# Patient Record
Sex: Female | Born: 1970 | Race: Asian | Hispanic: No | Marital: Married | Smoking: Never smoker
Health system: Southern US, Community
[De-identification: ages and names within clinical notes are randomized; demographics above are authoritative.]

## PROBLEM LIST (undated history)

## (undated) DIAGNOSIS — D649 Anemia, unspecified: Secondary | ICD-10-CM

## (undated) DIAGNOSIS — I10 Essential (primary) hypertension: Secondary | ICD-10-CM

---

## 1999-06-30 ENCOUNTER — Other Ambulatory Visit: Admission: RE | Admit: 1999-06-30 | Discharge: 1999-06-30 | Payer: Self-pay | Admitting: *Deleted

## 1999-08-10 ENCOUNTER — Inpatient Hospital Stay (HOSPITAL_COMMUNITY): Admission: AD | Admit: 1999-08-10 | Discharge: 1999-08-10 | Payer: Self-pay | Admitting: Obstetrics

## 2000-04-08 ENCOUNTER — Other Ambulatory Visit: Admission: RE | Admit: 2000-04-08 | Discharge: 2000-04-08 | Payer: Self-pay | Admitting: Obstetrics

## 2000-12-09 ENCOUNTER — Encounter (HOSPITAL_COMMUNITY): Admission: RE | Admit: 2000-12-09 | Discharge: 2000-12-13 | Payer: Self-pay | Admitting: Obstetrics & Gynecology

## 2000-12-12 ENCOUNTER — Inpatient Hospital Stay (HOSPITAL_COMMUNITY): Admission: AD | Admit: 2000-12-12 | Discharge: 2000-12-14 | Payer: Self-pay | Admitting: Obstetrics

## 2002-06-29 ENCOUNTER — Other Ambulatory Visit: Admission: RE | Admit: 2002-06-29 | Discharge: 2002-06-29 | Payer: Self-pay | Admitting: Obstetrics and Gynecology

## 2002-08-16 ENCOUNTER — Ambulatory Visit (HOSPITAL_COMMUNITY): Admission: RE | Admit: 2002-08-16 | Discharge: 2002-08-16 | Payer: Self-pay | Admitting: Obstetrics and Gynecology

## 2002-08-16 ENCOUNTER — Encounter: Payer: Self-pay | Admitting: Obstetrics and Gynecology

## 2003-01-08 ENCOUNTER — Inpatient Hospital Stay (HOSPITAL_COMMUNITY): Admission: AD | Admit: 2003-01-08 | Discharge: 2003-01-10 | Payer: Self-pay | Admitting: Obstetrics and Gynecology

## 2005-06-10 ENCOUNTER — Other Ambulatory Visit: Admission: RE | Admit: 2005-06-10 | Discharge: 2005-06-10 | Payer: Self-pay | Admitting: Obstetrics and Gynecology

## 2006-01-03 ENCOUNTER — Inpatient Hospital Stay (HOSPITAL_COMMUNITY): Admission: AD | Admit: 2006-01-03 | Discharge: 2006-01-06 | Payer: Self-pay | Admitting: Obstetrics and Gynecology

## 2013-10-12 HISTORY — PX: BREAST SURGERY: SHX581

## 2014-12-25 ENCOUNTER — Other Ambulatory Visit: Payer: Self-pay | Admitting: Obstetrics and Gynecology

## 2014-12-25 DIAGNOSIS — R928 Other abnormal and inconclusive findings on diagnostic imaging of breast: Secondary | ICD-10-CM

## 2019-06-02 ENCOUNTER — Other Ambulatory Visit: Payer: Self-pay | Admitting: Obstetrics and Gynecology

## 2019-06-02 DIAGNOSIS — N6489 Other specified disorders of breast: Secondary | ICD-10-CM

## 2019-06-12 ENCOUNTER — Other Ambulatory Visit: Payer: Self-pay

## 2019-06-12 ENCOUNTER — Other Ambulatory Visit: Payer: Self-pay | Admitting: Obstetrics and Gynecology

## 2019-06-12 ENCOUNTER — Ambulatory Visit
Admission: RE | Admit: 2019-06-12 | Discharge: 2019-06-12 | Disposition: A | Discharge status: Home / Self Care | Payer: PRIVATE HEALTH INSURANCE | Source: Ambulatory Visit | Attending: Obstetrics and Gynecology | Admitting: Obstetrics and Gynecology

## 2019-06-12 DIAGNOSIS — N6489 Other specified disorders of breast: Secondary | ICD-10-CM

## 2019-12-17 ENCOUNTER — Ambulatory Visit: Payer: PRIVATE HEALTH INSURANCE | Attending: Internal Medicine

## 2019-12-17 DIAGNOSIS — Z23 Encounter for immunization: Secondary | ICD-10-CM | POA: Insufficient documentation

## 2019-12-17 NOTE — Progress Notes (Signed)
   Covid-19 Vaccination Clinic  Name:  Susan Brown    MRN: [id number] DOB: 1971-06-23  12/17/2019  [name] was observed post Covid-19 immunization for 15 minutes without incident. She was provided with Vaccine Information Sheet and instruction to access the V-Safe system.   [name] was instructed to call 911 with any severe reactions post vaccine: Marland Kitchen Difficulty breathing  . Swelling of face and throat  . A fast heartbeat  . A bad rash all over body  . Dizziness and weakness   Immunizations Administered    Name Date Dose VIS Date Route   Pfizer COVID-19 Vaccine 12/17/2019  2:26 PM 0.3 mL 09/22/2019 Intramuscular   Manufacturer: [place]   Lot: [id number]   [place]: [id number]

## 2020-01-10 ENCOUNTER — Ambulatory Visit: Payer: Self-pay | Attending: Internal Medicine

## 2020-01-10 DIAGNOSIS — Z23 Encounter for immunization: Secondary | ICD-10-CM

## 2020-01-10 NOTE — Progress Notes (Signed)
   Covid-19 Vaccination Clinic  Name:  Susan Brown    MRN: [id number] DOB: 13-Feb-1971  01/10/2020  Susan Brown was observed post Covid-19 immunization for 15 minutes without incident. She was provided with Vaccine Information Sheet and instruction to access the V-Safe system.   Susan Brown was instructed to call 911 with any severe reactions post vaccine: Marland Kitchen Difficulty breathing  . Swelling of face and throat  . A fast heartbeat  . A bad rash all over body  . Dizziness and weakness   Immunizations Administered    Name Date Dose VIS Date Route   Pfizer COVID-19 Vaccine 01/10/2020  4:50 PM 0.3 mL 09/22/2019 Intramuscular   Manufacturer: [place]   Lot: [id number]   [place]: [id number]

## 2020-07-15 ENCOUNTER — Ambulatory Visit: Payer: Self-pay | Attending: Internal Medicine

## 2020-07-15 DIAGNOSIS — Z23 Encounter for immunization: Secondary | ICD-10-CM

## 2020-07-15 NOTE — Progress Notes (Signed)
° °  Covid-19 Vaccination Clinic  Name:  Susan Brown    MRN: [id number] DOB: October 15, 1970  07/15/2020  Ms. Susan Brown was observed post Covid-19 immunization for 15 minutes without incident. She was provided with Vaccine Information Sheet and instruction to access the V-Safe system.   Ms. Susan Brown was instructed to call 911 with any severe reactions post vaccine:  Difficulty breathing   Swelling of face and throat   A fast heartbeat   A bad rash all over body   Dizziness and weakness

## 2020-08-13 IMAGING — MG MM  DIGITAL DIAGNOSTIC BREAST BILAT IMPLANT W/ TOMO W/ CAD
8 of 14 series · 8 of 34 positions shown · non-contrast
Comparison: Previous exam(s).

CLINICAL DATA: 48-year-old who was recalled from screening
mammography in December 2014 for a possible asymmetry involving the
RIGHT breast. The patient returns today for follow-up. Annual
evaluation, LEFT breast.

EXAM:
DIGITAL DIAGNOSTIC BILATERAL MAMMOGRAM WITH IMPLANTS, CAD AND TOMO
ULTRASOUND LEFT BREAST

[R MLO]
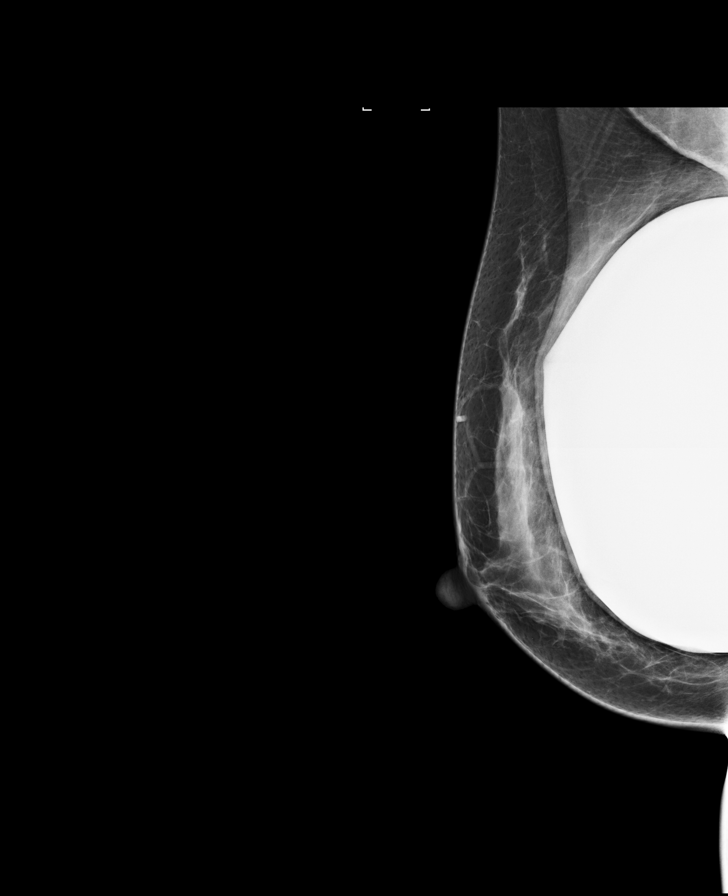

[L CC]
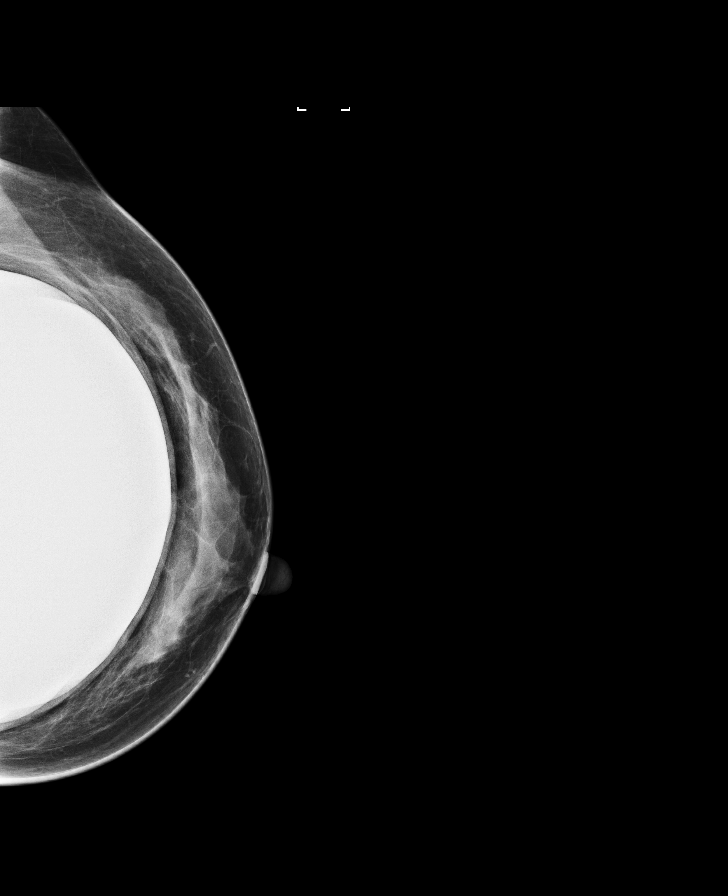

[R CC]
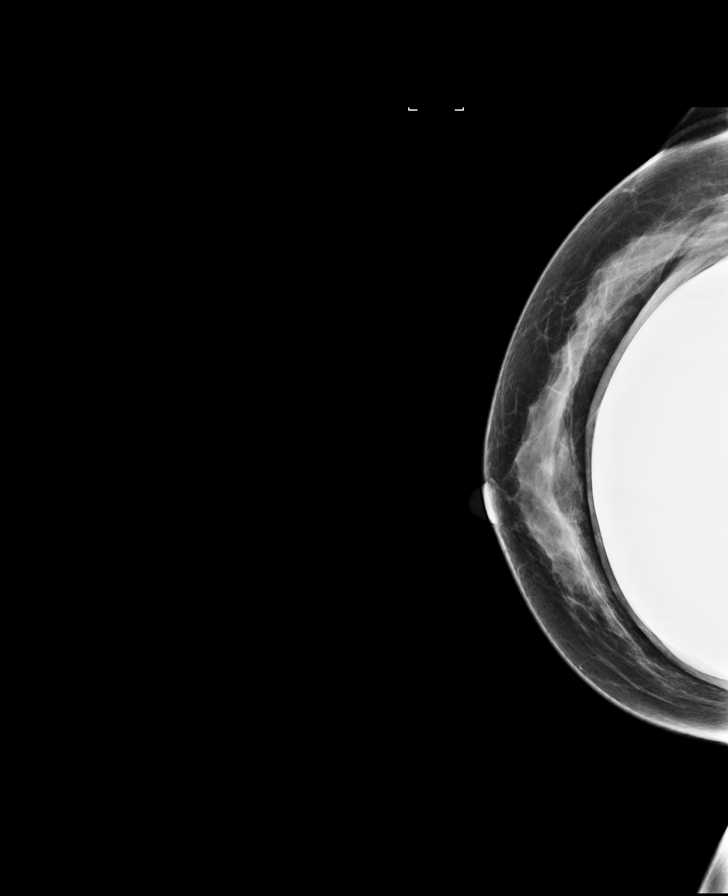

[L MLO]
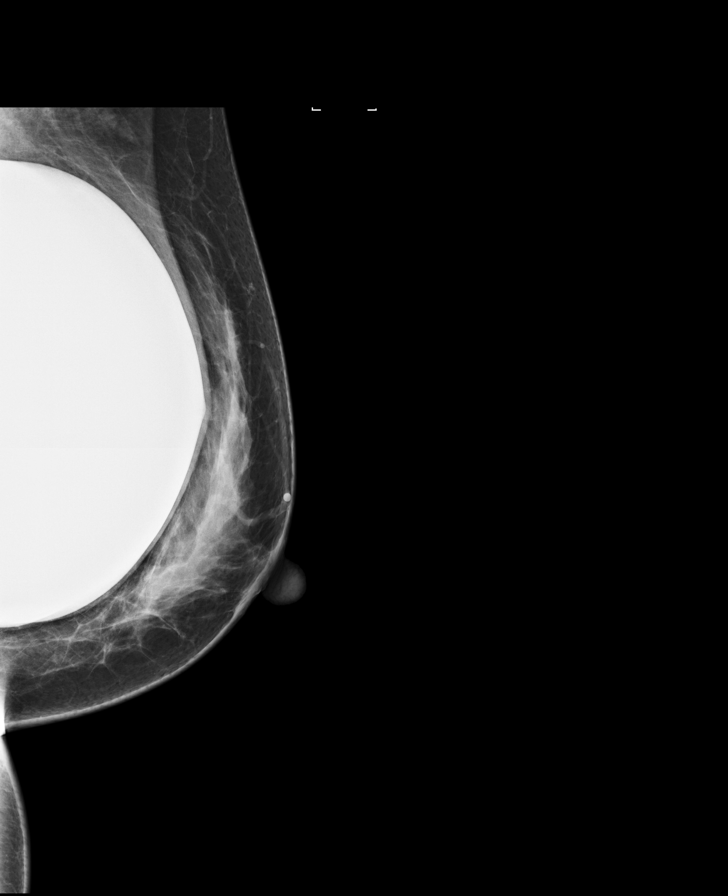

[R MLO synth-2D]
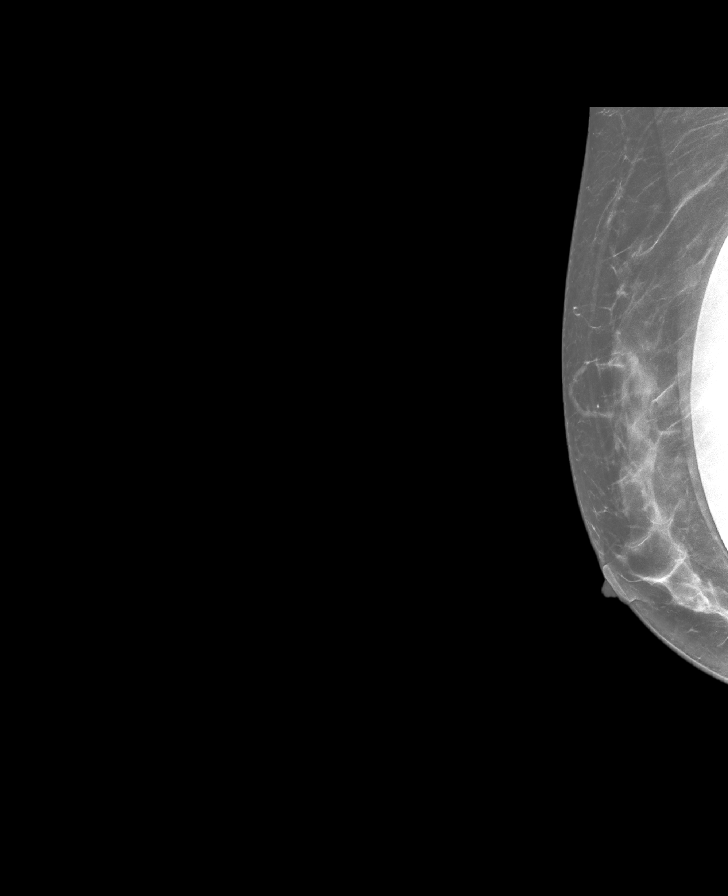

[L MLO synth-2D (1 of 2)]
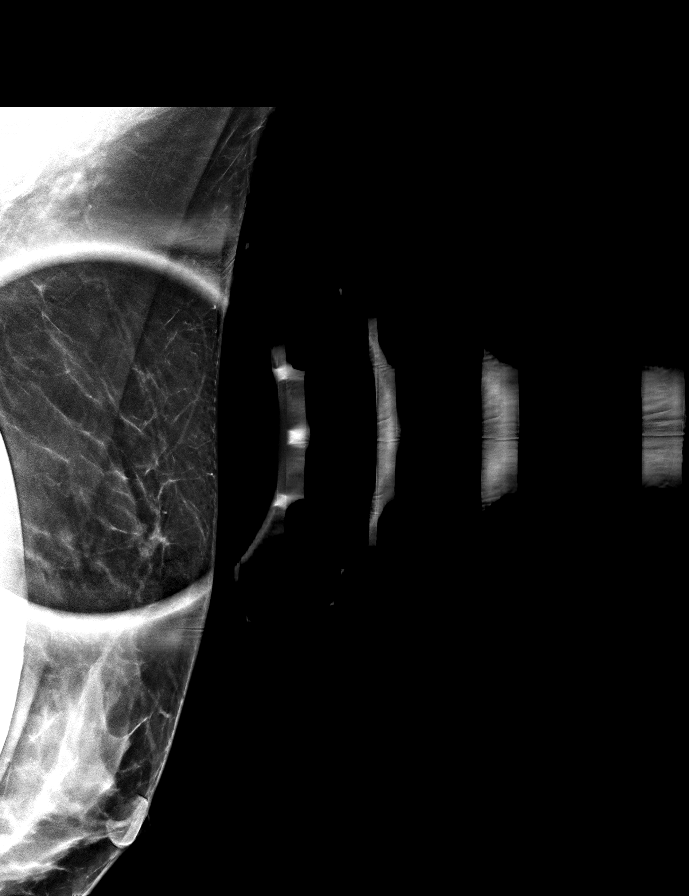

[L MLO synth-2D (2 of 2)]
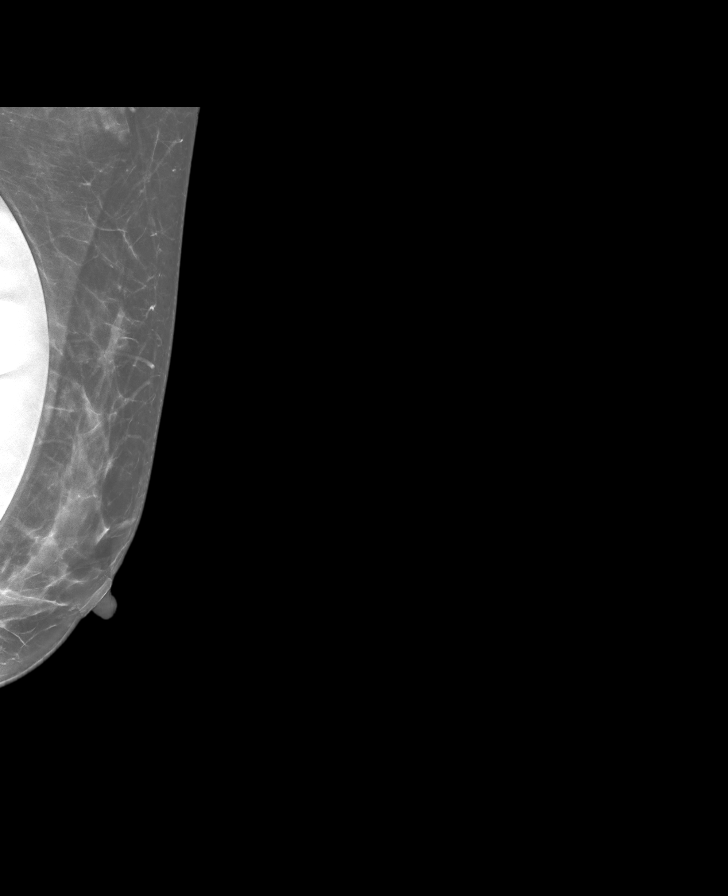

[R CC synth-2D]
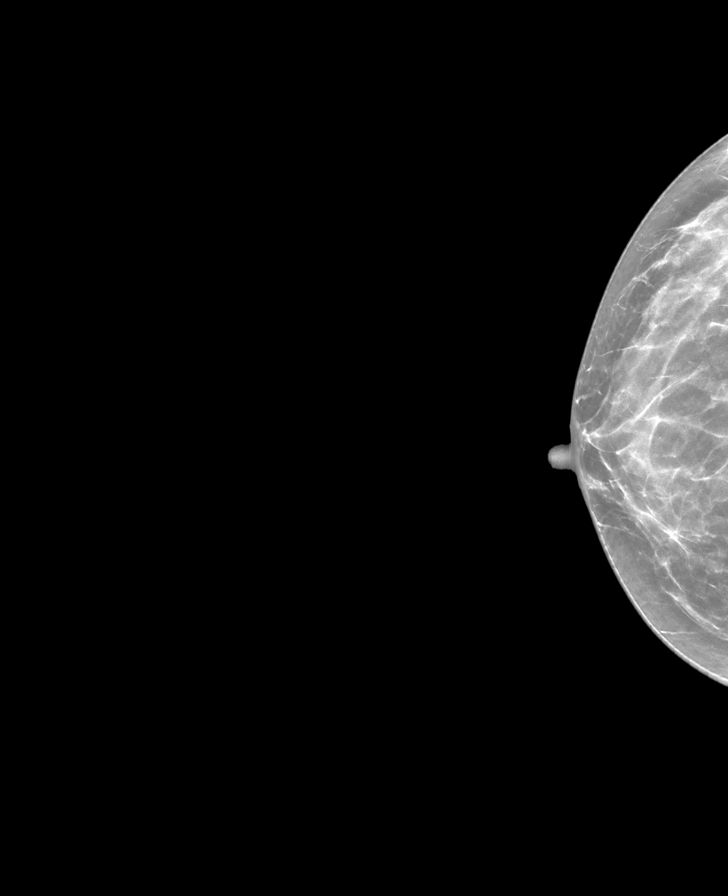

[8 of 34 positions shown; findings below may reference images not displayed]

ACR Breast Density Category c: The breast tissue is heterogeneously
dense, which may obscure small masses.
FINDINGS: The patient has retropectoral saline implants. Standard 2D full
field CC and MLO views of both breasts and tomosynthesis and
synthesized implant displaced CC and MLO views of both breasts were
obtained.

The asymmetry questioned in the slight UPPER RIGHT breast on the
2736 examination is no longer visible and therefore likely
represented normal fibroglandular tissue. No findings suspicious for
malignancy in the LEFT breast.

An asymmetry in the axillary tail of the LEFT breast visible on the
MLO images was not imaged on prior mammograms; the asymmetry
disperses with compression on the spot images and therefore likely
represents an island of normal fibroglandular tissue. No new or
suspicious findings elsewhere in the LEFT breast.

Mammographic images were processed with CAD.

On correlative physical exam, there is no palpable abnormality in
the axillary tail of the LEFT breast.

Targeted LEFT breast ultrasound is performed in the axillary tail,
showing an island of normal fibroglandular tissue corresponding to
the mammographic asymmetry. No cyst, solid mass, abnormal acoustic
shadowing or pathologic lymphadenopathy is identified.
IMPRESSION: 1. No mammographic or sonographic evidence of malignancy involving
the LEFT breast.
2. No mammographic evidence of malignancy involving the RIGHT
breast.
3. Isolated island of fibroglandular tissue in the axillary tail of
the LEFT breast which accounts for an asymmetry on today's mammogram
in an area that was not imaged previously.

RECOMMENDATION:
Screening mammogram in one year.(Code:8T-[id number])

I have discussed the findings and recommendations with the patient.
If applicable, a reminder letter will be sent to the patient
regarding the next appointment.

BI-RADS CATEGORY  2: Benign.

## 2022-02-16 ENCOUNTER — Ambulatory Visit (LOCAL_COMMUNITY_HEALTH_CENTER): Payer: Commercial Managed Care - PPO

## 2022-02-16 DIAGNOSIS — Z23 Encounter for immunization: Secondary | ICD-10-CM

## 2022-02-16 DIAGNOSIS — Z719 Counseling, unspecified: Secondary | ICD-10-CM

## 2022-02-16 NOTE — Progress Notes (Signed)
Presents to nurse clinic for needed vaccine(s). Twinrix given; pt tolerated well.  Instructed to return in 4 weeks for dose #2 per recommended schedule.  VIS given.  [facility] updated and copy to pt. ?Tonny Branch, RN  ?

## 2022-08-24 ENCOUNTER — Ambulatory Visit: Payer: Commercial Managed Care - PPO

## 2022-10-14 ENCOUNTER — Encounter (HOSPITAL_BASED_OUTPATIENT_CLINIC_OR_DEPARTMENT_OTHER): Payer: Self-pay | Admitting: Obstetrics and Gynecology

## 2022-10-14 NOTE — Anesthesia Preprocedure Evaluation (Signed)
Anesthesia Evaluation  Patient identified by MRN, date of birth, ID band Patient awake    Reviewed: Allergy & Precautions, NPO status , Patient's Chart, lab work & pertinent test results  History of Anesthesia Complications Negative for: history of anesthetic complications  Airway Mallampati: III  TM Distance: >3 FB Neck ROM: Full    Dental  (+) Dental Advisory Given, Teeth Intact   Pulmonary neg pulmonary ROS   Pulmonary exam normal        Cardiovascular hypertension, Pt. on medications Normal cardiovascular exam     Neuro/Psych negative neurological ROS  negative psych ROS   GI/Hepatic negative GI ROS, Neg liver ROS,,,  Endo/Other  negative endocrine ROS    Renal/GU negative Renal ROS     Musculoskeletal negative musculoskeletal ROS (+)    Abdominal   Peds  Hematology negative hematology ROS (+)   Anesthesia Other Findings   Reproductive/Obstetrics                             Anesthesia Physical Anesthesia Plan  ASA: 2  Anesthesia Plan: General   Post-op Pain Management: Tylenol PO (pre-op)* and Celebrex PO (pre-op)*   Induction: Intravenous  PONV Risk Score and Plan: 3 and Treatment may vary due to age or medical condition, Ondansetron, Dexamethasone and Midazolam  Airway Management Planned: LMA  Additional Equipment: None  Intra-op Plan:   Post-operative Plan: Extubation in OR  Informed Consent: I have reviewed the patients History and Physical, chart, labs and discussed the procedure including the risks, benefits and alternatives for the proposed anesthesia with the patient or authorized representative who has indicated his/her understanding and acceptance.     Dental advisory given  Plan Discussed with: CRNA and Anesthesiologist  Anesthesia Plan Comments:        Anesthesia Quick Evaluation

## 2022-10-14 NOTE — H&P (Signed)
Susan Brown is an 52 y.o. female. She was seen last February for annual exam, monthly menses, flow varies.  Exam at that time with 16 week size uterus c/w myomas.  She was not having significant discomfort.  Options were discussed, she chose to try [place], and used this for 5 months, menses have stopped.  She noticed no significant change in the size of her myomas.  Further options have been discussed, she wants to proceed with [facility] ablation.  By ultrasound in October, she has at least 6 measurable myomas, the largest of which is 9 cm.  Pertinent Gynecological History: Last mammogram: normal Date: 01/2022 Last pap: normal Date: 05/2019 OB History: G3, P3003 SVD x 3   Menstrual History: No LMP recorded.    Past Medical History:  Diagnosis Date   Hypertension     Past Surgical History:  Procedure Laterality Date   BREAST SURGERY Bilateral 2015   implants    History reviewed. No pertinent family history.  Social History:  reports that she has never smoked. She has never used smokeless tobacco. She reports that she does not drink alcohol and does not use drugs.  Allergies: Not on File  No medications prior to admission.    Review of Systems  Respiratory: Negative.    Cardiovascular: Negative.     There were no vitals taken for this visit. Physical Exam Constitutional:      Appearance: Normal appearance.  Cardiovascular:     Rate and Rhythm: Normal rate and regular rhythm.     Heart sounds: Normal heart sounds. No murmur heard. Pulmonary:     Effort: Pulmonary effort is normal. No respiratory distress.     Breath sounds: Normal breath sounds. No wheezing.  Abdominal:     General: There is no distension.     Palpations: Abdomen is soft.     Tenderness: There is no abdominal tenderness.     Comments: Uterine fundus palpable about 1/2 way to umbilicus  Genitourinary:    General: Normal vulva.     Comments: Uterus 16 weeks, irregular Musculoskeletal:      Cervical back: Normal range of motion and neck supple.  Neurological:     Mental Status: She is alert.     No results found for this or any previous visit (from the past 24 hour(s)).  No results found.  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  All medical and surgical options have been discussed, including her probable proximity to menopause with subsequent shrinkage of myomas.  She wants to proceed with Sonata ablation of myomas.  Surgical procedure, risks, alternatives, chances of shrinking myomas all discussed, questions answered.  Will admit for hysteroscopy, possible Myosure resection, Sonata ablation  Clarene Duke 10/14/2022, 6:55 PM

## 2022-10-14 NOTE — Progress Notes (Signed)
Spoke w/ via phone for pre-op interview--- Artisha Lab needs dos----   CBC, UPT             Lab results------ COVID test ----patient states asymptomatic no test needed Arrive at -------  0530 NPO after MN NO Solid Food.  Clear liquids from MN until--- Med rec completed Medications to take morning of surgery ----- none Diabetic medication ----- n/a Patient instructed no nail polish to be worn day of surgery Patient instructed to bring photo id and insurance card day of surgery Patient aware to have Driver (ride ) / caregiver    for 24 hours after surgery husband Minh [phone] Patient Special Instructions ----- Pre-Op special Istructions ----- Patient verbalized understanding of instructions that were given at this phone interview. Patient denies shortness of breath, chest pain, fever, cough at this phone interview.

## 2022-10-15 ENCOUNTER — Ambulatory Visit (HOSPITAL_BASED_OUTPATIENT_CLINIC_OR_DEPARTMENT_OTHER)
Admission: RE | Admit: 2022-10-15 | Discharge: 2022-10-15 | Disposition: A | Discharge status: Home / Self Care | Payer: Commercial Managed Care - PPO | Attending: Obstetrics and Gynecology | Admitting: Obstetrics and Gynecology

## 2022-10-15 ENCOUNTER — Encounter (HOSPITAL_BASED_OUTPATIENT_CLINIC_OR_DEPARTMENT_OTHER)
Admission: RE | Disposition: A | Discharge status: Home / Self Care | Payer: Self-pay | Source: Home / Self Care | Attending: Obstetrics and Gynecology

## 2022-10-15 ENCOUNTER — Ambulatory Visit (HOSPITAL_BASED_OUTPATIENT_CLINIC_OR_DEPARTMENT_OTHER): Payer: Commercial Managed Care - PPO | Admitting: Anesthesiology

## 2022-10-15 ENCOUNTER — Encounter (HOSPITAL_BASED_OUTPATIENT_CLINIC_OR_DEPARTMENT_OTHER): Payer: Self-pay | Admitting: Obstetrics and Gynecology

## 2022-10-15 ENCOUNTER — Other Ambulatory Visit: Payer: Self-pay

## 2022-10-15 DIAGNOSIS — D259 Leiomyoma of uterus, unspecified: Secondary | ICD-10-CM | POA: Diagnosis present

## 2022-10-15 DIAGNOSIS — D251 Intramural leiomyoma of uterus: Secondary | ICD-10-CM | POA: Diagnosis not present

## 2022-10-15 DIAGNOSIS — I10 Essential (primary) hypertension: Secondary | ICD-10-CM | POA: Insufficient documentation

## 2022-10-15 HISTORY — PX: DILATATION & CURETTAGE/HYSTEROSCOPY WITH MYOSURE: SHX6511

## 2022-10-15 HISTORY — DX: Essential (primary) hypertension: I10

## 2022-10-15 LAB — CBC
HCT: 42.8 % (ref 36.0–46.0)
Hemoglobin: 14.1 g/dL (ref 12.0–15.0)
MCH: 29.4 pg (ref 26.0–34.0)
MCHC: 32.9 g/dL (ref 30.0–36.0)
MCV: 89.2 fL (ref 80.0–100.0)
Platelets: 279 10*3/uL (ref 150–400)
RBC: 4.8 MIL/uL (ref 3.87–5.11)
RDW: 13.2 % (ref 11.5–15.5)
WBC: 6.3 10*3/uL (ref 4.0–10.5)
nRBC: 0 % (ref 0.0–0.2)

## 2022-10-15 LAB — POCT PREGNANCY, URINE: Preg Test, Ur: NEGATIVE

## 2022-10-15 SURGERY — DILATATION & CURETTAGE/HYSTEROSCOPY WITH MYOSURE
Anesthesia: General | Site: Uterus

## 2022-10-15 MED ORDER — SODIUM CHLORIDE 0.9 % IR SOLN
Status: DC | PRN
  Administered 2022-10-15: 3000 mL

## 2022-10-15 MED ORDER — MIDAZOLAM HCL 5 MG/5ML IJ SOLN
INTRAMUSCULAR | Status: DC | PRN
  Administered 2022-10-15: 2 mg via INTRAVENOUS

## 2022-10-15 MED ORDER — LIDOCAINE HCL (PF) 2 % IJ SOLN
INTRAMUSCULAR | Status: AC
  Filled 2022-10-15: qty 5

## 2022-10-15 MED ORDER — PROPOFOL 10 MG/ML IV BOLUS
INTRAVENOUS | Status: DC | PRN
  Administered 2022-10-15: 40 mg via INTRAVENOUS
  Administered 2022-10-15: 160 mg via INTRAVENOUS

## 2022-10-15 MED ORDER — PHENYLEPHRINE 80 MCG/ML (10ML) SYRINGE FOR IV PUSH (FOR BLOOD PRESSURE SUPPORT)
PREFILLED_SYRINGE | INTRAVENOUS | Status: AC
  Filled 2022-10-15: qty 10

## 2022-10-15 MED ORDER — MIDAZOLAM HCL 2 MG/2ML IJ SOLN
INTRAMUSCULAR | Status: AC
  Filled 2022-10-15: qty 2

## 2022-10-15 MED ORDER — ONDANSETRON HCL 4 MG/2ML IJ SOLN
INTRAMUSCULAR | Status: AC
  Filled 2022-10-15: qty 2

## 2022-10-15 MED ORDER — PHENYLEPHRINE 80 MCG/ML (10ML) SYRINGE FOR IV PUSH (FOR BLOOD PRESSURE SUPPORT)
PREFILLED_SYRINGE | INTRAVENOUS | Status: DC | PRN
  Administered 2022-10-15 (×2): 80 ug via INTRAVENOUS

## 2022-10-15 MED ORDER — FENTANYL CITRATE (PF) 100 MCG/2ML IJ SOLN
INTRAMUSCULAR | Status: DC | PRN
  Administered 2022-10-15: 25 ug via INTRAVENOUS
  Administered 2022-10-15: 50 ug via INTRAVENOUS
  Administered 2022-10-15: 25 ug via INTRAVENOUS

## 2022-10-15 MED ORDER — PHENYLEPHRINE HCL-NACL 20-0.9 MG/250ML-% IV SOLN
INTRAVENOUS | Status: DC | PRN
  Administered 2022-10-15: 40 ug/min via INTRAVENOUS

## 2022-10-15 MED ORDER — LIDOCAINE HCL 2 % IJ SOLN: INTRAMUSCULAR | Status: DC | PRN

## 2022-10-15 MED ORDER — ACETAMINOPHEN 500 MG PO TABS
ORAL_TABLET | ORAL | Status: AC
  Filled 2022-10-15: qty 2

## 2022-10-15 MED ORDER — LACTATED RINGERS IV SOLN: INTRAVENOUS | Status: DC

## 2022-10-15 MED ORDER — LIDOCAINE 2% (20 MG/ML) 5 ML SYRINGE
INTRAMUSCULAR | Status: DC | PRN
  Administered 2022-10-15: 60 mg via INTRAVENOUS

## 2022-10-15 MED ORDER — DEXAMETHASONE SODIUM PHOSPHATE 10 MG/ML IJ SOLN
INTRAMUSCULAR | Status: DC | PRN
  Administered 2022-10-15: 5 mg via INTRAVENOUS

## 2022-10-15 MED ORDER — PHENYLEPHRINE HCL (PRESSORS) 10 MG/ML IV SOLN
INTRAVENOUS | Status: AC
  Filled 2022-10-15: qty 1

## 2022-10-15 MED ORDER — ARTIFICIAL TEARS OPHTHALMIC OINT
TOPICAL_OINTMENT | OPHTHALMIC | Status: AC
  Filled 2022-10-15: qty 3.5

## 2022-10-15 MED ORDER — ONDANSETRON HCL 4 MG/2ML IJ SOLN
INTRAMUSCULAR | Status: DC | PRN
  Administered 2022-10-15: 4 mg via INTRAVENOUS

## 2022-10-15 MED ORDER — HYDROCODONE-ACETAMINOPHEN 5-325 MG PO TABS: 1.0000 | ORAL_TABLET | Freq: Four times a day (QID) | ORAL | 0 refills | Status: AC | PRN

## 2022-10-15 MED ORDER — FENTANYL CITRATE (PF) 100 MCG/2ML IJ SOLN: 25.0000 ug | INTRAMUSCULAR | Status: DC | PRN

## 2022-10-15 MED ORDER — PROPOFOL 10 MG/ML IV BOLUS
INTRAVENOUS | Status: AC
  Filled 2022-10-15: qty 20

## 2022-10-15 MED ORDER — DEXAMETHASONE SODIUM PHOSPHATE 10 MG/ML IJ SOLN
INTRAMUSCULAR | Status: AC
  Filled 2022-10-15: qty 1

## 2022-10-15 MED ORDER — ACETAMINOPHEN 500 MG PO TABS
1000.0000 mg | ORAL_TABLET | Freq: Once | ORAL | Status: AC
  Administered 2022-10-15: 1000 mg via ORAL

## 2022-10-15 MED ORDER — OXYCODONE HCL 5 MG/5ML PO SOLN: 5.0000 mg | Freq: Once | ORAL | Status: DC | PRN

## 2022-10-15 MED ORDER — OXYCODONE HCL 5 MG PO TABS: 5.0000 mg | ORAL_TABLET | Freq: Once | ORAL | Status: DC | PRN

## 2022-10-15 MED ORDER — CELECOXIB 200 MG PO CAPS
200.0000 mg | ORAL_CAPSULE | Freq: Once | ORAL | Status: AC
  Administered 2022-10-15: 200 mg via ORAL

## 2022-10-15 MED ORDER — FENTANYL CITRATE (PF) 100 MCG/2ML IJ SOLN
INTRAMUSCULAR | Status: AC
  Filled 2022-10-15: qty 2

## 2022-10-15 MED ORDER — STERILE WATER FOR IRRIGATION IR SOLN
Status: DC | PRN
  Administered 2022-10-15: 500 mL

## 2022-10-15 MED ORDER — IBUPROFEN 800 MG PO TABS: 800.0000 mg | ORAL_TABLET | Freq: Three times a day (TID) | ORAL | 0 refills | Status: DC | PRN

## 2022-10-15 MED ORDER — PROMETHAZINE HCL 25 MG/ML IJ SOLN: 6.2500 mg | INTRAMUSCULAR | Status: DC | PRN

## 2022-10-15 MED ORDER — CELECOXIB 200 MG PO CAPS
ORAL_CAPSULE | ORAL | Status: AC
  Filled 2022-10-15: qty 1

## 2022-10-15 SURGICAL SUPPLY — 29 items
CATH ROBINSON RED A/P 16FR (CATHETERS) ×2 IMPLANT
DEVICE MYOSURE LITE (MISCELLANEOUS) IMPLANT
DEVICE MYOSURE REACH (MISCELLANEOUS) IMPLANT
DILATOR CANAL MILEX (MISCELLANEOUS) IMPLANT
DRSG TELFA 3X8 NADH STRL (GAUZE/BANDAGES/DRESSINGS) ×2 IMPLANT
ELECT DISPERSIVE SONATA (ELECTRODE) ×4 IMPLANT
ELECT REM PT RETURN 9FT ADLT (ELECTROSURGICAL)
ELECTRODE REM PT RTRN 9FT ADLT (ELECTROSURGICAL) ×2 IMPLANT
GAUZE 4X4 16PLY ~~LOC~~+RFID DBL (SPONGE) ×4 IMPLANT
GLOVE BIOGEL PI IND STRL 7.0 (GLOVE) ×2 IMPLANT
GLOVE ECLIPSE 6.5 STRL STRAW (GLOVE) ×2 IMPLANT
GLOVE ORTHO TXT STRL SZ7.5 (GLOVE) ×2 IMPLANT
GOWN STRL REUS W/TWL LRG LVL3 (GOWN DISPOSABLE) ×2 IMPLANT
HANDPIECE RFA SONATA (MISCELLANEOUS) ×2 IMPLANT
IV NS IRRIG 3000ML ARTHROMATIC (IV SOLUTION) ×4 IMPLANT
KIT PROCEDURE FLUENT (KITS) ×2 IMPLANT
KIT TURNOVER CYSTO (KITS) ×2 IMPLANT
LOOP CUTTING BIPOLAR 21FR (ELECTRODE) IMPLANT
MYOSURE XL FIBROID (MISCELLANEOUS)
PACK VAGINAL MINOR WOMEN LF (CUSTOM PROCEDURE TRAY) ×2 IMPLANT
PAD OB MATERNITY 4.3X12.25 (PERSONAL CARE ITEMS) ×2 IMPLANT
PAD PREP 24X48 CUFFED NSTRL (MISCELLANEOUS) ×2 IMPLANT
SEAL CERVICAL OMNI LOK (ABLATOR) IMPLANT
SEAL ROD LENS SCOPE MYOSURE (ABLATOR) ×2 IMPLANT
SUT VIC AB 2-0 CT2 27 (SUTURE) IMPLANT
SYR 50ML LL SCALE MARK (SYRINGE) ×2 IMPLANT
SYSTEM TISS REMOVAL MYOSURE XL (MISCELLANEOUS) IMPLANT
TOWEL OR 17X26 10 PK STRL BLUE (TOWEL DISPOSABLE) ×2 IMPLANT
WATER STERILE IRR 500ML POUR (IV SOLUTION) ×2 IMPLANT

## 2022-10-15 NOTE — Op Note (Signed)
Preoperative diagnosis: Symptomatic myomatous uterus Postoperative diagnosis:  same Procedure: Hysteroscopy, D&C and myoma ablation with Sonata Surgeon: Cheri Fowler, MD Anesthesia: General endotracheal tube Estimated blood loss: 50cc Findings: She had at least 8 measurable intramural myomas that were treated, no myomas that could not be treated Specimens: Endometrial curettings  Procedure in detail:  Patient was taken to the operating room and placed in the dorsal supine position.  General anesthesia was induced and the patient was placed in mobile stirrups.  Perineum and vagina were then prepped and draped in the usual sterile fashion, she voided before coming to the OR.  Speculum was inserted and the anterior cervix grasped with a single-tooth tenaculum.  Cervix was then dilated to a size 23 dilator and the MyoSure hysteroscope was inserted.  Good visualization was achieved.  The endometrial cavity was normal, both tubal ostia visualized, no significant submucosal myomas.  Hysteroscope was removed.  Uterine curettage was performed which removed a small amount of tissue.  The cervix was further dilated to a size 27 dilator and the Sonata device was inserted.  A complete uterine survey was performed with the Sonata ultrasound.  Sonata fibroid ablation was then performed of 8-9 myomas:  Fibroid #1 was 8 cm at 2-3:00 and used 4 ablations of 5-7 minutes, fibroid 2 was at 12:00 measuring 5 cm and ablated for 6 minutes and 36 seconds, fibroid 3 was at 11:00 measuring 1.5 cm and this was ablated for a minute and 24 seconds, fibroid 4 was at 6:00 and measured 3 cm and was ablated for 2 minutes and 42 seconds, fibroid 5 was at 8:00 and measured 4 cm and was ablated twice, 5 minutes six seconds and 2 minutes 34 seconds-this was more an area with 2-3 myomas, fibroid 6 was at 4:00, 2 cm, ablated for 1 minute. All cycles were carried out under direct ultrasound intrauterine guidance and visualization with the  ablation guide noted to be within the serosa at all times.  The fibroids treated appeared ablated with ultrasound guidance with outgassing noted by ultrasound appearance.  Once ablation was completed the [facility] device was removed.  The single-tooth tenaculum was removed from the cervix and bleeding controlled with pressure and one figure 8 2-0 Vicryl.  The patient was awakened in the operating room and taken to the recovery room in stable condition.  Counts were correct and she had PAS hose on throughout the procedure.

## 2022-10-15 NOTE — Discharge Instructions (Addendum)
Routine instructions for hysteroscopy and sonata  DISCHARGE INSTRUCTIONS: HYSTEROSCOPY / ENDOMETRIAL ABLATION   May take stool softner while taking narcotic pain medication to prevent constipation.  Drink plenty of water.  Personal hygiene:  Use sanitary pads for vaginal drainage, not tampons.  Shower the day after your procedure.  NO tub baths, pools or Jacuzzis for 2-3 weeks.  Wipe front to back after using the bathroom.  Activity and limitations:  Do NOT drive or operate any equipment for 24 hours. The effects of anesthesia are still present and drowsiness may result.  Do NOT rest in bed all day.  Walking is encouraged.  Walk up and down stairs slowly.  You may resume your normal activity in one to two days or as indicated by your physician. Sexual activity: NO intercourse for at least 2 weeks after the procedure, or as indicated by your Doctor.  Diet: Eat a light meal as desired this evening. You may resume your usual diet tomorrow.  Return to Work: You may resume your work activities in one to two days or as indicated by Marine scientist.  What to expect after your surgery: Expect to have vaginal bleeding/discharge for 2-3 days and spotting for up to 10 days. It is not unusual to have soreness for up to 1-2 weeks. You may have a slight burning sensation when you urinate for the first day. Mild cramps may continue for a couple of days. You may have a regular period in 2-6 weeks.  Call your doctor for any of the following:  Excessive vaginal bleeding or clotting, saturating and changing one pad every hour.  Inability to urinate 6 hours after discharge from hospital.  Pain not relieved by pain medication.  Fever of 100.4 F or greater.  Unusual vaginal discharge or odor.   Post Anesthesia Home Care Instructions  Activity: Get plenty of rest for the remainder of the day. A responsible individual must stay with you for 24 hours following the procedure.  For the next 24 hours,  DO NOT: -Drive a car -Paediatric nurse -Drink alcoholic beverages -Take any medication unless instructed by your physician -Make any legal decisions or sign important papers.  Meals: Start with liquid foods such as gelatin or soup. Progress to regular foods as tolerated. Avoid greasy, spicy, heavy foods. If nausea and/or vomiting occur, drink only clear liquids until the nausea and/or vomiting subsides. Call your physician if vomiting continues.  Special Instructions/Symptoms: Your throat may feel dry or sore from the anesthesia or the breathing tube placed in your throat during surgery. If this causes discomfort, gargle with warm salt water. The discomfort should disappear within 24 hours.  No acetaminophen/Tylenol until after 12 pm today if needed. No ibuprofen, Advil, Aleve, Motrin, ketorolac, meloxicam, naproxen, or other NSAIDS until after 12 pm today if needed.

## 2022-10-15 NOTE — Interval H&P Note (Signed)
History and Physical Interval Note:  10/15/2022 7:16 AM  Susan Brown  has presented today for surgery, with the diagnosis of symptomatic myomas.  The various methods of treatment have been discussed with the patient and family. After consideration of risks, benefits and other options for treatment, the patient has consented to  Procedure(s): DILATATION & CURETTAGE/HYSTEROSCOPY WITH POSSIBLE MYOSURE (N/A) Radio Frequency Ablation with Sonata (N/A) as a surgical intervention.  The patient's history has been reviewed, patient examined, no change in status, stable for surgery.  I have reviewed the patient's chart and labs.  Questions were answered to the patient's satisfaction.     Blane Ohara Ishmeal Rorie

## 2022-10-15 NOTE — Anesthesia Postprocedure Evaluation (Signed)
Anesthesia Post Note  Patient: Susan Brown Susan Brown  Procedure(s) Performed: DILATATION & CURETTAGE/HYSTEROSCOPY (Uterus) Radio Frequency Ablation with Sonata (Uterus)     Patient location during evaluation: PACU Anesthesia Type: General Level of consciousness: awake and alert Pain management: pain level controlled Vital Signs Assessment: post-procedure vital signs reviewed and stable Respiratory status: spontaneous breathing, nonlabored ventilation and respiratory function stable Cardiovascular status: stable and blood pressure returned to baseline Anesthetic complications: no   No notable events documented.  Last Vitals:  Vitals:   10/15/22 1015 10/15/22 1030  BP: 106/73 115/79  Pulse: 78 83  Resp: 13 17  Temp:    SpO2: 96% 96%    Last Pain:  Vitals:   10/15/22 1030  TempSrc:   PainSc: [place]

## 2022-10-15 NOTE — Anesthesia Procedure Notes (Signed)
Procedure Name: LMA Insertion Date/Time: 10/15/2022 7:27 AM  Performed by: Rogers Blocker, CRNAPre-anesthesia Checklist: Patient identified, Emergency Drugs available, Suction available and Patient being monitored Patient Re-evaluated:Patient Re-evaluated prior to induction Oxygen Delivery Method: Circle System Utilized Preoxygenation: Pre-oxygenation with 100% oxygen Induction Type: IV induction Ventilation: Mask ventilation without difficulty LMA: LMA inserted LMA Size: 4.0 Number of attempts: 1 Placement Confirmation: positive ETCO2 Tube secured with: Tape Dental Injury: Teeth and Oropharynx as per pre-operative assessment

## 2022-10-15 NOTE — Transfer of Care (Signed)
Immediate Anesthesia Transfer of Care Note  Patient: Susan Brown  Procedure(s) Performed: DILATATION & CURETTAGE/HYSTEROSCOPY (Uterus) Radio Frequency Ablation with Sonata (Uterus)  Patient Location: PACU  Anesthesia Type:General  Level of Consciousness: drowsy, patient cooperative, and responds to stimulation  Airway & Oxygen Therapy: Patient Spontanous Breathing  Post-op Assessment: Report given to RN and Post -op Vital signs reviewed and stable  Post vital signs: Reviewed and stable  Last Vitals:  Vitals Value Taken Time  BP 114/72 10/15/22 0945  Temp 36.8 C 10/15/22 0945  Pulse 73 10/15/22 0947  Resp 14 10/15/22 0947  SpO2 95 % 10/15/22 0947  Vitals shown include unvalidated device data.  Last Pain:  Vitals:   10/15/22 0553  TempSrc: Oral  PainSc: 0-No pain      Patients Stated Pain Goal: 3 (61/53/79 4327)  Complications: No notable events documented.

## 2022-10-16 ENCOUNTER — Encounter (HOSPITAL_BASED_OUTPATIENT_CLINIC_OR_DEPARTMENT_OTHER): Payer: Self-pay | Admitting: Obstetrics and Gynecology

## 2022-10-16 LAB — SURGICAL PATHOLOGY

## 2023-10-28 ENCOUNTER — Other Ambulatory Visit: Payer: Self-pay | Admitting: Obstetrics and Gynecology

## 2023-10-28 DIAGNOSIS — R5381 Other malaise: Secondary | ICD-10-CM

## 2023-11-11 ENCOUNTER — Encounter: Payer: Self-pay | Admitting: Internal Medicine

## 2023-11-12 ENCOUNTER — Other Ambulatory Visit: Payer: Self-pay | Admitting: Obstetrics and Gynecology

## 2023-11-12 DIAGNOSIS — R5381 Other malaise: Secondary | ICD-10-CM

## 2023-11-12 DIAGNOSIS — R921 Mammographic calcification found on diagnostic imaging of breast: Secondary | ICD-10-CM

## 2023-11-12 DIAGNOSIS — R928 Other abnormal and inconclusive findings on diagnostic imaging of breast: Secondary | ICD-10-CM

## 2023-11-15 ENCOUNTER — Other Ambulatory Visit: Payer: Self-pay | Admitting: Obstetrics and Gynecology

## 2023-11-15 DIAGNOSIS — R928 Other abnormal and inconclusive findings on diagnostic imaging of breast: Secondary | ICD-10-CM

## 2023-11-15 DIAGNOSIS — R921 Mammographic calcification found on diagnostic imaging of breast: Secondary | ICD-10-CM

## 2023-11-18 ENCOUNTER — Telehealth: Payer: Self-pay

## 2023-11-18 ENCOUNTER — Inpatient Hospital Stay: Payer: Commercial Managed Care - PPO | Attending: Oncology | Admitting: Oncology

## 2023-11-18 ENCOUNTER — Encounter: Payer: Self-pay | Admitting: Oncology

## 2023-11-18 ENCOUNTER — Inpatient Hospital Stay: Payer: Commercial Managed Care - PPO

## 2023-11-18 ENCOUNTER — Other Ambulatory Visit: Payer: Self-pay

## 2023-11-18 VITALS — BP 122/86 | HR 79 | Temp 97.6°F | Resp 18 | Wt 135.9 lb

## 2023-11-18 DIAGNOSIS — D509 Iron deficiency anemia, unspecified: Secondary | ICD-10-CM | POA: Diagnosis present

## 2023-11-18 DIAGNOSIS — D500 Iron deficiency anemia secondary to blood loss (chronic): Secondary | ICD-10-CM

## 2023-11-18 LAB — CBC WITH DIFFERENTIAL/PLATELET
Abs Immature Granulocytes: 0.01 10*3/uL (ref 0.00–0.07)
Basophils Absolute: 0 10*3/uL (ref 0.0–0.1)
Basophils Relative: 0 %
Eosinophils Absolute: 0.2 10*3/uL (ref 0.0–0.5)
Eosinophils Relative: 4 %
HCT: 30.8 % — ABNORMAL LOW (ref 36.0–46.0)
Hemoglobin: 9.2 g/dL — ABNORMAL LOW (ref 12.0–15.0)
Immature Granulocytes: 0 %
Lymphocytes Relative: 38 %
Lymphs Abs: 1.9 10*3/uL (ref 0.7–4.0)
MCH: 21.4 pg — ABNORMAL LOW (ref 26.0–34.0)
MCHC: 29.9 g/dL — ABNORMAL LOW (ref 30.0–36.0)
MCV: 71.8 fL — ABNORMAL LOW (ref 80.0–100.0)
Monocytes Absolute: 0.5 10*3/uL (ref 0.1–1.0)
Monocytes Relative: 10 %
Neutro Abs: 2.4 10*3/uL (ref 1.7–7.7)
Neutrophils Relative %: 48 %
Platelets: 379 10*3/uL (ref 150–400)
RBC: 4.29 MIL/uL (ref 3.87–5.11)
RDW: 17.9 % — ABNORMAL HIGH (ref 11.5–15.5)
WBC: 5 10*3/uL (ref 4.0–10.5)
nRBC: 0 % (ref 0.0–0.2)

## 2023-11-18 LAB — TECHNOLOGIST SMEAR REVIEW: Plt Morphology: ADEQUATE

## 2023-11-18 LAB — RETIC PANEL
Immature Retic Fract: 27.3 % — ABNORMAL HIGH (ref 2.3–15.9)
RBC.: 4.37 MIL/uL (ref 3.87–5.11)
Retic Count, Absolute: 58.1 10*3/uL (ref 19.0–186.0)
Retic Ct Pct: 1.3 % (ref 0.4–3.1)
Reticulocyte Hemoglobin: 21.7 pg — ABNORMAL LOW (ref >27.9)

## 2023-11-18 LAB — IRON AND TIBC
Iron: 30 ug/dL (ref 28–170)
Saturation Ratios: 5 % — ABNORMAL LOW (ref 10.4–31.8)
TIBC: 567 ug/dL — ABNORMAL HIGH (ref 250–450)
UIBC: 537 ug/dL

## 2023-11-18 LAB — FERRITIN: Ferritin: 3 ng/mL — ABNORMAL LOW (ref 11–307)

## 2023-11-18 MED ORDER — VITRON-C 65-125 MG PO TABS: 1.0000 | ORAL_TABLET | Freq: Every day | ORAL | 0 refills | Status: AC

## 2023-11-18 NOTE — Telephone Encounter (Signed)
 Spoke to pt and informed her of MD recommendation Pt verbalized understanding. Vitron C rx sent to [facility] per pt request.   Please contact pt prior to setting up appts:  2 months: Labs prior to MD/ venfoer

## 2023-11-18 NOTE — Telephone Encounter (Signed)
-----   Message from [name] sent at 11/18/2023 12:51 PM EST ----- Please let patient know that she has iron deficiency. Recommend patient to start Vitron C 1 tab daily [OTC supply and I have showed her the picture of medication]  Follow up in 2 months lab prior to MD +/- venofer thanks.

## 2023-11-18 NOTE — Assessment & Plan Note (Addendum)
 Labs are reviewed and discussed with patient. Microcytic anemia, suspect iron deficiency.  Check cbc, iron tibc ferritin.   Lab Results  Component Value Date   HGB 9.2 (L) 11/18/2023   TIBC 567 (H) 11/18/2023   IRONPCTSAT 5 (L) 11/18/2023   FERRITIN 3 (L) 11/18/2023    Lab results confirm iron deficiency anemia.  I discussed about option of continue oral iron supplementation and repeat blood work for evaluation of treatment response.  If no significant improvement, then proceed with IV Venofer treatments. Alternative option of proceed with IV Venofer treatments. I discussed about the potential risks including but not limited to allergic reactions/infusion reactions including anaphylactic reactions, phlebitis, high blood pressure, wheezing, SOB, skin rash, weight gain,dark urine, leg swelling, back pain, headache, nausea and fatigue, etc. Patient prefers to try oral iron supplementation first. Will repeat labs in 2 months and if no improvement with oral iron, consider IV Venofer.

## 2023-11-18 NOTE — Progress Notes (Signed)
 Hematology/Oncology Consult note [phone] [phone]        REFERRING PROVIDER: Weyman Bright, MD   CHIEF COMPLAINTS/REASON FOR VISIT:  Evaluation of anemia   ASSESSMENT & PLAN:   Iron deficiency anemia Labs are reviewed and discussed with patient. Microcytic anemia, suspect iron deficiency.  Check cbc, iron tibc ferritin.   Lab Results  Component Value Date   HGB 9.2 (L) 11/18/2023   TIBC 567 (H) 11/18/2023   IRONPCTSAT 5 (L) 11/18/2023   FERRITIN 3 (L) 11/18/2023    Lab results confirm iron deficiency anemia.  I discussed about option of continue oral iron supplementation and repeat blood work for evaluation of treatment response.  If no significant improvement, then proceed with IV Venofer treatments. Alternative option of proceed with IV Venofer treatments. I discussed about the potential risks including but not limited to allergic reactions/infusion reactions including anaphylactic reactions, phlebitis, high blood pressure, wheezing, SOB, skin rash, weight gain,dark urine, leg swelling, back pain, headache, nausea and fatigue, etc. Patient prefers to try oral iron supplementation first. Will repeat labs in 2 months and if no improvement with oral iron, consider IV Venofer.    Orders Placed This Encounter  Procedures   Iron and TIBC    Standing Status:   Future    Number of Occurrences:   1    Expected Date:   11/18/2023    Expiration Date:   11/17/2024   Ferritin    Standing Status:   Future    Number of Occurrences:   1    Expected Date:   11/18/2023    Expiration Date:   05/17/2024   Retic Panel    Standing Status:   Future    Number of Occurrences:   1    Expected Date:   11/18/2023    Expiration Date:   11/17/2024   CBC with Differential/Platelet    Standing Status:   Future    Number of Occurrences:   1    Expected Date:   11/18/2023    Expiration Date:   11/17/2024   Technologist smear review    Standing Status:   Future    Number of  Occurrences:   1    Expected Date:   11/18/2023    Expiration Date:   11/17/2024    Clinical information::   anemia   Follow up  2 months.  All questions were answered. The patient knows to call the clinic with any problems, questions or concerns.  [name], MD, PhD [facility] Hematology Oncology 11/18/2023   HISTORY OF PRESENTING ILLNESS:   Susan Brown is a  53 y.o.  female with PMH listed below was seen in consultation at the request of  Weyman Bright, MD  for evaluation of anemia.   Blood work conducted on November 01, 2023, revealed decreased hemoglobin at 9 g/dL, hematocrit at 70.1%, and MCV at 73 fL, indicating microcytic anemia. Previous blood work from the last year showed normal levels with a hemoglobin of 11.6 g/dL.  She reports last period was notably heavier and longer, lasting five to seven days, compared to her usual regular cycles.  She feels 'a little tired' but denies any blood in the stool or dark stools. No family history of colon cancer or any other cancer is reported.  She does not consume alcohol or smoke cigarettes. She is not on any blood thinners, aspirin, or ibuprofen  and has never taken iron supplementation    MEDICAL HISTORY:  Past  Medical History:  Diagnosis Date   Hypertension     SURGICAL HISTORY: Past Surgical History:  Procedure Laterality Date   BREAST SURGERY Bilateral 2015   implants   DILATATION & CURETTAGE/HYSTEROSCOPY WITH MYOSURE N/A 10/15/2022   Procedure: DILATATION & CURETTAGE/HYSTEROSCOPY;  Surgeon: Horacio Boas, MD;  Location: [facility];  Service: Gynecology;  Laterality: N/A;    SOCIAL HISTORY: Social History   Socioeconomic History   Marital status: Married    Spouse name: Not on file   Number of children: Not on file   Years of education: Not on file   Highest education level: Not on file  Occupational History   Not on file  Tobacco Use   Smoking status: Never   Smokeless tobacco: Never   Vaping Use   Vaping status: Never Used  Substance and Sexual Activity   Alcohol use: Never   Drug use: Never   Sexual activity: Not on file  Other Topics Concern   Not on file  Social History Narrative   Not on file   Social Drivers of Health   Financial Resource Strain: Not on file  Food Insecurity: No Food Insecurity (11/18/2023)   Hunger Vital Sign    Worried About Running Out of Food in the Last Year: Never true    Ran Out of Food in the Last Year: Never true  Transportation Needs: No Transportation Needs (11/18/2023)   PRAPARE - Administrator, Civil Service (Medical): No    Lack of Transportation (Non-Medical): No  Physical Activity: Not on file  Stress: Not on file  Social Connections: Not on file  Intimate Partner Violence: Not At Risk (11/18/2023)   Humiliation, Afraid, Rape, and Kick questionnaire    Fear of Current or Ex-Partner: No    Emotionally Abused: No    Physically Abused: No    Sexually Abused: No    FAMILY HISTORY: Family History  Problem Relation Age of Onset   Diabetes Father     ALLERGIES:  has no known allergies.  MEDICATIONS:  Current Outpatient Medications  Medication Sig Dispense Refill   atorvastatin (LIPITOR) 10 MG tablet Take 10 mg by mouth daily.     HYDROcodone -acetaminophen  (NORCO/VICODIN) 5-325 MG tablet Take 1 tablet by mouth every 6 (six) hours as needed for severe pain. 10 tablet 0   lisinopril (ZESTRIL) 10 MG tablet Take 10 mg by mouth daily.     No current facility-administered medications for this visit.    Review of Systems  Constitutional:  Positive for fatigue. Negative for appetite change, chills and fever.  HENT:   Negative for hearing loss and voice change.   Eyes:  Negative for eye problems.  Respiratory:  Negative for chest tightness and cough.   Cardiovascular:  Negative for chest pain.  Gastrointestinal:  Negative for abdominal distention, abdominal pain and blood in stool.  Endocrine: Negative for hot  flashes.  Genitourinary:  Positive for menstrual problem. Negative for difficulty urinating and frequency.   Musculoskeletal:  Negative for arthralgias.  Skin:  Negative for itching and rash.  Neurological:  Negative for extremity weakness.  Hematological:  Negative for adenopathy.  Psychiatric/Behavioral:  Negative for confusion.    PHYSICAL EXAMINATION: ECOG PERFORMANCE STATUS: 0 - Asymptomatic Vitals:   11/18/23 0917  BP: 122/86  Pulse: 79  Resp: 18  Temp: 97.6 F (36.4 C)   Filed Weights   11/18/23 0917  Weight: 135 lb 14.4 oz (61.6 kg)    Physical Exam Constitutional:  General: She is not in acute distress. HENT:     Head: Normocephalic and atraumatic.  Eyes:     General: No scleral icterus. Cardiovascular:     Rate and Rhythm: Normal rate and regular rhythm.     Heart sounds: Normal heart sounds.  Pulmonary:     Effort: Pulmonary effort is normal. No respiratory distress.     Breath sounds: No wheezing.  Abdominal:     General: Bowel sounds are normal. There is no distension.     Palpations: Abdomen is soft.  Musculoskeletal:        General: No deformity. Normal range of motion.     Cervical back: Normal range of motion and neck supple.  Skin:    General: Skin is warm and dry.     Findings: No erythema or rash.  Neurological:     Mental Status: She is alert and oriented to person, place, and time. Mental status is at baseline.  Psychiatric:        Mood and Affect: Mood normal.     LABORATORY DATA:  I have reviewed the data as listed    Latest Ref Rng & Units 11/18/2023    9:44 AM 10/15/2022    6:06 AM  CBC  WBC 4.0 - 10.5 K/uL 5.0  6.3   Hemoglobin 12.0 - 15.0 g/dL 9.2  85.8   Hematocrit 36.0 - 46.0 % 30.8  42.8   Platelets 150 - 400 K/uL 379  279    Lab Results  Component Value Date   HGB 9.2 (L) 11/18/2023   TIBC 567 (H) 11/18/2023   IRONPCTSAT 5 (L) 11/18/2023   FERRITIN 3 (L) 11/18/2023        RADIOGRAPHIC STUDIES: I have  personally reviewed the radiological images as listed and agreed with the findings in the report. No results found.

## 2023-11-23 ENCOUNTER — Ambulatory Visit
Admission: RE | Admit: 2023-11-23 | Discharge: 2023-11-23 | Disposition: A | Discharge status: Home / Self Care | Payer: Commercial Managed Care - PPO | Source: Ambulatory Visit | Attending: Obstetrics and Gynecology | Admitting: Obstetrics and Gynecology

## 2023-11-23 DIAGNOSIS — R921 Mammographic calcification found on diagnostic imaging of breast: Secondary | ICD-10-CM

## 2023-11-23 DIAGNOSIS — R928 Other abnormal and inconclusive findings on diagnostic imaging of breast: Secondary | ICD-10-CM

## 2023-12-06 ENCOUNTER — Telehealth: Payer: Self-pay

## 2023-12-06 NOTE — Telephone Encounter (Signed)
 The patient husband Rogelia Boga) called in to schedule her colonoscopy with Dr. Tobi Bastos.

## 2023-12-06 NOTE — Telephone Encounter (Signed)
 This referral is for Anemia - D64.9 not for screening for colon cancer.

## 2023-12-08 NOTE — Telephone Encounter (Signed)
 The patient husband Rogelia Boga) called in to schedule her colonoscopy with Dr. Tobi Bastos. He said that he is going to call Dr. Dario Guardian because his wife only needs a colonoscopy.

## 2023-12-22 ENCOUNTER — Other Ambulatory Visit: Payer: Self-pay

## 2023-12-22 ENCOUNTER — Encounter: Payer: Self-pay | Admitting: Oncology

## 2023-12-22 ENCOUNTER — Telehealth: Payer: Self-pay

## 2023-12-22 DIAGNOSIS — N852 Hypertrophy of uterus: Secondary | ICD-10-CM | POA: Insufficient documentation

## 2023-12-22 DIAGNOSIS — I10 Essential (primary) hypertension: Secondary | ICD-10-CM | POA: Insufficient documentation

## 2023-12-22 DIAGNOSIS — Z1211 Encounter for screening for malignant neoplasm of colon: Secondary | ICD-10-CM

## 2023-12-22 DIAGNOSIS — D259 Leiomyoma of uterus, unspecified: Secondary | ICD-10-CM | POA: Insufficient documentation

## 2023-12-22 MED ORDER — NA SULFATE-K SULFATE-MG SULF 17.5-3.13-1.6 GM/177ML PO SOLN: 1.0000 | Freq: Once | ORAL | 0 refills | Status: AC

## 2023-12-22 NOTE — Telephone Encounter (Signed)
 PCP office sent referral for IDA and Screening Colonoscopy.  PCP office has been made aware that we are not taking new patient appts at this time due to staff changes, however patient has been scheduled screening colonoscopy since she has never had one. She also has a referral in to hematology to address IDA.  Gastroenterology Pre-Procedure Review  Request Date: 01/19/24 Requesting Physician: Dr. Tobi Bastos  PATIENT REVIEW QUESTIONS: The patient responded to the following health history questions as indicated:    1. Are you having any GI issues? no 2. Do you have a personal history of Polyps? no 3. Do you have a family history of Colon Cancer or Polyps? no 4. Diabetes Mellitus? no 5. Joint replacements in the past 12 months?no 6. Major health problems in the past 3 months?no 7. Any artificial heart valves, MVP, or defibrillator?no    MEDICATIONS & ALLERGIES:    Patient reports the following regarding taking any anticoagulation/antiplatelet therapy:   Plavix, Coumadin, Eliquis, Xarelto, Lovenox, Pradaxa, Brilinta, or Effient? no Aspirin? no  Patient confirms/reports the following medications:  Current Outpatient Medications  Medication Sig Dispense Refill   Na Sulfate-K Sulfate-Mg Sulfate concentrate (SUPREP) 17.5-3.13-1.6 GM/177ML SOLN Take 1 kit (354 mLs total) by mouth once for 1 dose. 354 mL 0   atorvastatin (LIPITOR) 10 MG tablet Take 10 mg by mouth daily.     HYDROcodone-acetaminophen (NORCO/VICODIN) 5-325 MG tablet Take 1 tablet by mouth every 6 (six) hours as needed for severe pain. 10 tablet 0   Iron-Vitamin C (VITRON-C) 65-125 MG TABS Take 1 tablet by mouth daily. 90 tablet 0   levothyroxine (SYNTHROID) 100 MCG tablet Take 1 tablet by mouth every morning.     lisinopril (ZESTRIL) 10 MG tablet Take 10 mg by mouth daily.     No current facility-administered medications for this visit.    Patient confirms/reports the following allergies:  No Known Allergies  No orders of the  defined types were placed in this encounter.   AUTHORIZATION INFORMATION Primary Insurance: 1D#: Group #:  Secondary Insurance: 1D#: Group #:  SCHEDULE INFORMATION: Date: 01/19/24 Time: Location: [facility]

## 2024-01-18 ENCOUNTER — Encounter: Payer: Self-pay | Admitting: Gastroenterology

## 2024-01-19 ENCOUNTER — Encounter: Payer: Self-pay | Admitting: Gastroenterology

## 2024-01-19 ENCOUNTER — Ambulatory Visit
Admission: RE | Admit: 2024-01-19 | Discharge: 2024-01-19 | Disposition: A | Discharge status: Home / Self Care | Attending: Gastroenterology | Admitting: Gastroenterology

## 2024-01-19 ENCOUNTER — Encounter
Admission: RE | Disposition: A | Discharge status: Home / Self Care | Payer: Self-pay | Source: Home / Self Care | Attending: Gastroenterology

## 2024-01-19 ENCOUNTER — Other Ambulatory Visit: Payer: Self-pay

## 2024-01-19 ENCOUNTER — Ambulatory Visit: Admitting: Anesthesiology

## 2024-01-19 ENCOUNTER — Other Ambulatory Visit: Payer: Commercial Managed Care - PPO

## 2024-01-19 DIAGNOSIS — D123 Benign neoplasm of transverse colon: Secondary | ICD-10-CM | POA: Diagnosis not present

## 2024-01-19 DIAGNOSIS — K635 Polyp of colon: Secondary | ICD-10-CM | POA: Diagnosis not present

## 2024-01-19 DIAGNOSIS — I10 Essential (primary) hypertension: Secondary | ICD-10-CM | POA: Insufficient documentation

## 2024-01-19 DIAGNOSIS — Z1211 Encounter for screening for malignant neoplasm of colon: Secondary | ICD-10-CM | POA: Insufficient documentation

## 2024-01-19 DIAGNOSIS — D126 Benign neoplasm of colon, unspecified: Secondary | ICD-10-CM

## 2024-01-19 HISTORY — DX: Anemia, unspecified: D64.9

## 2024-01-19 HISTORY — PX: COLONOSCOPY: SHX5424

## 2024-01-19 HISTORY — PX: POLYPECTOMY: SHX149

## 2024-01-19 LAB — POCT PREGNANCY, URINE: Preg Test, Ur: NEGATIVE

## 2024-01-19 SURGERY — COLONOSCOPY
Anesthesia: General

## 2024-01-19 MED ORDER — DEXMEDETOMIDINE HCL IN NACL 80 MCG/20ML IV SOLN
INTRAVENOUS | Status: DC | PRN
Start: 2024-01-19 — End: 2024-01-19
  Administered 2024-01-19: 20 ug via INTRAVENOUS

## 2024-01-19 MED ORDER — PROPOFOL 1000 MG/100ML IV EMUL
INTRAVENOUS | Status: AC
  Filled 2024-01-19: qty 100

## 2024-01-19 MED ORDER — EPHEDRINE 5 MG/ML INJ
INTRAVENOUS | Status: AC
  Filled 2024-01-19: qty 5

## 2024-01-19 MED ORDER — SODIUM CHLORIDE 0.9 % IV SOLN: INTRAVENOUS | Status: DC

## 2024-01-19 MED ORDER — LIDOCAINE HCL (CARDIAC) PF 100 MG/5ML IV SOSY
PREFILLED_SYRINGE | INTRAVENOUS | Status: DC | PRN
  Administered 2024-01-19: 60 mg via INTRAVENOUS

## 2024-01-19 MED ORDER — PROPOFOL 10 MG/ML IV BOLUS
INTRAVENOUS | Status: DC | PRN
  Administered 2024-01-19: 50 mg via INTRAVENOUS

## 2024-01-19 MED ORDER — EPHEDRINE SULFATE-NACL 50-0.9 MG/10ML-% IV SOSY
PREFILLED_SYRINGE | INTRAVENOUS | Status: DC | PRN
  Administered 2024-01-19: 10 mg via INTRAVENOUS

## 2024-01-19 MED ORDER — PROPOFOL 500 MG/50ML IV EMUL
INTRAVENOUS | Status: DC | PRN
  Administered 2024-01-19: 75 ug/kg/min via INTRAVENOUS

## 2024-01-19 NOTE — Op Note (Signed)
 [facility] Gastroenterology Patient Name: Susan Brown Procedure Date: 01/19/2024 11:31 AM MRN: [id number] Account #: [id number] Date of Birth: 29-Nov-1970 Admit Type: Outpatient Age: 53 Room: [facility] ENDO ROOM 3 Gender: Female Note Status: Finalized Instrument Name: Prentice Docker [id number] Procedure:             Colonoscopy Indications:           Screening for colorectal malignant neoplasm Providers:             Wyline Mood MD, MD Referring MD:          Tracey Harries (Referring MD) Medicines:             Monitored Anesthesia Care Complications:         No immediate complications. Procedure:             Pre-Anesthesia Assessment:                        - Prior to the procedure, a History and Physical was                         performed, and patient medications, allergies and                         sensitivities were reviewed. The patient's tolerance                         of previous anesthesia was reviewed.                        - The risks and benefits of the procedure and the                         sedation options and risks were discussed with the                         patient. All questions were answered and informed                         consent was obtained.                        - ASA Grade Assessment: II - A patient with mild                         systemic disease.                        After obtaining informed consent, the colonoscope was                         passed under direct vision. Throughout the procedure,                         the patient's blood pressure, pulse, and oxygen                         saturations were monitored continuously. The                         Colonoscope was introduced through  the anus and                         advanced to the the cecum, identified by the                         appendiceal orifice. The colonoscopy was performed                         with ease. The patient tolerated the procedure well.                          The quality of the bowel preparation was excellent.                         The ileocecal valve, appendiceal orifice, and rectum                         were photographed. Findings:      The perianal and digital rectal examinations were normal.      A 5 mm polyp was found in the transverse colon. The polyp was sessile.       The polyp was removed with a cold snare. Resection and retrieval were       complete.      The exam was otherwise without abnormality on direct and retroflexion       views. Impression:            - One 5 mm polyp in the transverse colon, removed with                         a cold snare. Resected and retrieved.                        - The examination was otherwise normal on direct and                         retroflexion views. Recommendation:        - Discharge patient to home (with escort).                        - Resume previous diet.                        - Continue present medications.                        - Await pathology results.                        - Repeat colonoscopy for surveillance based on                         pathology results. Procedure Code(s):     --- Professional ---                        [phone], Colonoscopy, flexible; with removal of                         tumor(s), polyp(s), or other lesion(s) by snare  technique Diagnosis Code(s):     --- Professional ---                        Z12.11, Encounter for screening for malignant neoplasm                         of colon                        D12.3, Benign neoplasm of transverse colon (hepatic                         flexure or splenic flexure) CPT copyright 2022 American Medical Association. All rights reserved. The codes documented in this report are preliminary and upon coder review may  be revised to meet current compliance requirements. Wyline Mood, MD Wyline Mood MD, MD 01/19/2024 12:53:48 PM This report has been signed  electronically. Number of Addenda: 0 Note Initiated On: 01/19/2024 11:31 AM Scope Withdrawal Time: 0 hours 9 minutes 6 seconds  Total Procedure Duration: 0 hours 11 minutes 26 seconds  Estimated Blood Loss:  Estimated blood loss: none.      [facility]

## 2024-01-19 NOTE — H&P (Signed)
 Wyline Mood, MD [address], [address], [place], [place], [address] [address], [place], [place], [address] Phone: [phone]  Fax: [phone]  Primary Care Physician:  Tracey Harries, MD   Pre-Procedure History & Physical: HPI:  Susan Brown is a 53 y.o. female is here for an colonoscopy.   Past Medical History:  Diagnosis Date   Hypertension     Past Surgical History:  Procedure Laterality Date   BREAST SURGERY Bilateral 2015   implants   DILATATION & CURETTAGE/HYSTEROSCOPY WITH MYOSURE N/A 10/15/2022   Procedure: DILATATION & CURETTAGE/HYSTEROSCOPY;  Surgeon: Lavina Hamman, MD;  Location: [facility];  Service: Gynecology;  Laterality: N/A;    Prior to Admission medications   Medication Sig Start Date End Date Taking? Authorizing Provider  atorvastatin (LIPITOR) 10 MG tablet Take 10 mg by mouth daily.    [provider]  HYDROcodone-acetaminophen (NORCO/VICODIN) 5-325 MG tablet Take 1 tablet by mouth every 6 (six) hours as needed for severe pain. 10/15/22   Meisinger, Todd, MD  Iron-Vitamin C (VITRON-C) 65-125 MG TABS Take 1 tablet by mouth daily. 11/18/23   Rickard Patience, MD  levothyroxine (SYNTHROID) 100 MCG tablet Take 1 tablet by mouth every morning.    [provider]  lisinopril (ZESTRIL) 10 MG tablet Take 10 mg by mouth daily.    [provider]    Allergies as of 12/22/2023   (No Known Allergies)    Family History  Problem Relation Age of Onset   Diabetes Father     Social History   Socioeconomic History   Marital status: Married    Spouse name: Not on file   Number of children: Not on file   Years of education: Not on file   Highest education level: Not on file  Occupational History   Not on file  Tobacco Use   Smoking status: Never   Smokeless tobacco: Never  Vaping Use   Vaping status: Never Used  Substance and Sexual Activity   Alcohol use: Never   Drug use: Never   Sexual  activity: Not on file  Other Topics Concern   Not on file  Social History Narrative   Not on file   Social Drivers of Health   Financial Resource Strain: Not on file  Food Insecurity: No Food Insecurity (11/18/2023)   Hunger Vital Sign    Worried About Running Out of Food in the Last Year: Never true    Ran Out of Food in the Last Year: Never true  Transportation Needs: No Transportation Needs (11/18/2023)   PRAPARE - Administrator, Civil Service (Medical): No    Lack of Transportation (Non-Medical): No  Physical Activity: Not on file  Stress: Not on file  Social Connections: Not on file  Intimate Partner Violence: Not At Risk (11/18/2023)   Humiliation, Afraid, Rape, and Kick questionnaire    Fear of Current or Ex-Partner: No    Emotionally Abused: No    Physically Abused: No    Sexually Abused: No    Review of Systems: See HPI, otherwise negative ROS  Physical Exam: There were no vitals taken for this visit. General:   Alert,  pleasant and cooperative in NAD Head:  Normocephalic and atraumatic. Neck:  Supple; no masses or thyromegaly. Lungs:  Clear throughout to auscultation, normal respiratory effort.    Heart:  +S1, +S2, Regular rate and rhythm, No edema. Abdomen:  Soft, nontender and nondistended. Normal bowel  sounds, without guarding, and without rebound.   Neurologic:  Alert and  oriented x4;  grossly normal neurologically.  Impression/Plan: Susan Brown is here for an colonoscopy to be performed for Screening colonoscopy average risk   Risks, benefits, limitations, and alternatives regarding  colonoscopy have been reviewed with the patient.  Questions have been answered.  All parties agreeable.   Wyline Mood, MD  01/19/2024, 11:11 AM

## 2024-01-19 NOTE — Anesthesia Preprocedure Evaluation (Signed)
 Anesthesia Evaluation  Patient identified by MRN, date of birth, ID band Patient awake    Reviewed: Allergy & Precautions, H&P , NPO status , Patient's Chart, lab work & pertinent test results, reviewed documented beta blocker date and time   History of Anesthesia Complications Negative for: history of anesthetic complications  Airway Mallampati: I  TM Distance: >3 FB Neck ROM: full    Dental  (+) Dental Advidsory Given, Teeth Intact   Pulmonary neg pulmonary ROS   Pulmonary exam normal breath sounds clear to auscultation       Cardiovascular Exercise Tolerance: Good hypertension, (-) angina (-) Past MI and (-) Cardiac Stents Normal cardiovascular exam(-) dysrhythmias (-) Valvular Problems/Murmurs Rhythm:regular Rate:Normal     Neuro/Psych negative neurological ROS  negative psych ROS   GI/Hepatic negative GI ROS, Neg liver ROS,,,  Endo/Other  neg diabetesHypothyroidism    Renal/GU negative Renal ROS  negative genitourinary   Musculoskeletal   Abdominal   Peds  Hematology negative hematology ROS (+)   Anesthesia Other Findings Past Medical History: No date: Anemia No date: Hypertension   Reproductive/Obstetrics negative OB ROS                             Anesthesia Physical Anesthesia Plan  ASA: 2  Anesthesia Plan: General   Post-op Pain Management:    Induction: Intravenous  PONV Risk Score and Plan: 3 and Propofol infusion, TIVA and Treatment may vary due to age or medical condition  Airway Management Planned: Natural Airway and Nasal Cannula  Additional Equipment:   Intra-op Plan:   Post-operative Plan:   Informed Consent: I have reviewed the patients History and Physical, chart, labs and discussed the procedure including the risks, benefits and alternatives for the proposed anesthesia with the patient or authorized representative who has indicated his/her understanding  and acceptance.     Dental Advisory Given  Plan Discussed with: Anesthesiologist, CRNA and Surgeon  Anesthesia Plan Comments:        Anesthesia Quick Evaluation

## 2024-01-19 NOTE — Transfer of Care (Signed)
 Immediate Anesthesia Transfer of Care Note  Patient: Susan Brown  Procedure(s) Performed: COLONOSCOPY POLYPECTOMY, INTESTINE  Patient Location: PACU  Anesthesia Type:General  Level of Consciousness: sedated  Airway & Oxygen Therapy: Patient Spontanous Breathing  Post-op Assessment: Report given to RN and Post -op Vital signs reviewed and stable  Post vital signs: Reviewed and stable  Last Vitals:  Vitals Value Taken Time  BP 100/62 01/19/24 1254  Temp    Pulse 89 01/19/24 1255  Resp 16 01/19/24 1255  SpO2 97 % 01/19/24 1255  Vitals shown include unfiled device data.  Last Pain:  Vitals:   01/19/24 1123  TempSrc: Temporal  PainSc: 0-No pain         Complications: No notable events documented.

## 2024-01-20 ENCOUNTER — Encounter: Payer: Self-pay | Admitting: Gastroenterology

## 2024-01-20 LAB — SURGICAL PATHOLOGY

## 2024-01-21 NOTE — Anesthesia Postprocedure Evaluation (Signed)
 Anesthesia Post Note  Patient: Susan Brown  Procedure(s) Performed: COLONOSCOPY POLYPECTOMY, INTESTINE  Patient location during evaluation: Endoscopy Anesthesia Type: General Level of consciousness: awake and alert Pain management: pain level controlled Vital Signs Assessment: post-procedure vital signs reviewed and stable Respiratory status: spontaneous breathing, nonlabored ventilation, respiratory function stable and patient connected to nasal cannula oxygen Cardiovascular status: blood pressure returned to baseline and stable Postop Assessment: no apparent nausea or vomiting Anesthetic complications: no   No notable events documented.   Last Vitals:  Vitals:   01/19/24 1310 01/19/24 1320  BP: 108/75 106/74  Pulse: 77 72  Resp: 15 17  Temp:    SpO2: 97% 99%    Last Pain:  Vitals:   01/20/24 0739  TempSrc:   PainSc: 0-No pain                 Lenard Simmer

## 2024-01-26 ENCOUNTER — Ambulatory Visit: Payer: Commercial Managed Care - PPO | Admitting: Oncology

## 2024-01-26 ENCOUNTER — Ambulatory Visit: Payer: Commercial Managed Care - PPO
